# Patient Record
Sex: Male | Born: 1955 | Race: Black or African American | Hispanic: No | Smoking: Current every day smoker
Health system: Southern US, Community
[De-identification: ages and names within clinical notes are randomized; demographics above are authoritative.]

## PROBLEM LIST (undated history)

## (undated) DIAGNOSIS — I10 Essential (primary) hypertension: Secondary | ICD-10-CM

## (undated) DIAGNOSIS — E119 Type 2 diabetes mellitus without complications: Secondary | ICD-10-CM

## (undated) DIAGNOSIS — E785 Hyperlipidemia, unspecified: Secondary | ICD-10-CM

## (undated) HISTORY — PX: JOINT REPLACEMENT: SHX530

---

## 2004-11-11 ENCOUNTER — Emergency Department: Payer: Self-pay | Admitting: Emergency Medicine

## 2006-08-22 ENCOUNTER — Emergency Department: Payer: Self-pay | Admitting: Emergency Medicine

## 2009-04-23 ENCOUNTER — Ambulatory Visit: Payer: Self-pay | Admitting: General Practice

## 2011-03-11 IMAGING — CR DG KNEE 1-2V*R*
1 series · 2 of 2 positions shown · non-contrast
Comparison: none

REASON FOR EXAM: rt hip replacment, osteoarthrtis in rt leg back probs
fax report
COMMENTS:  to [facility] [phone]

[Series 1: view not recorded · 0.17mm/px · 2 of 2 slices shown]
[im 1/2]
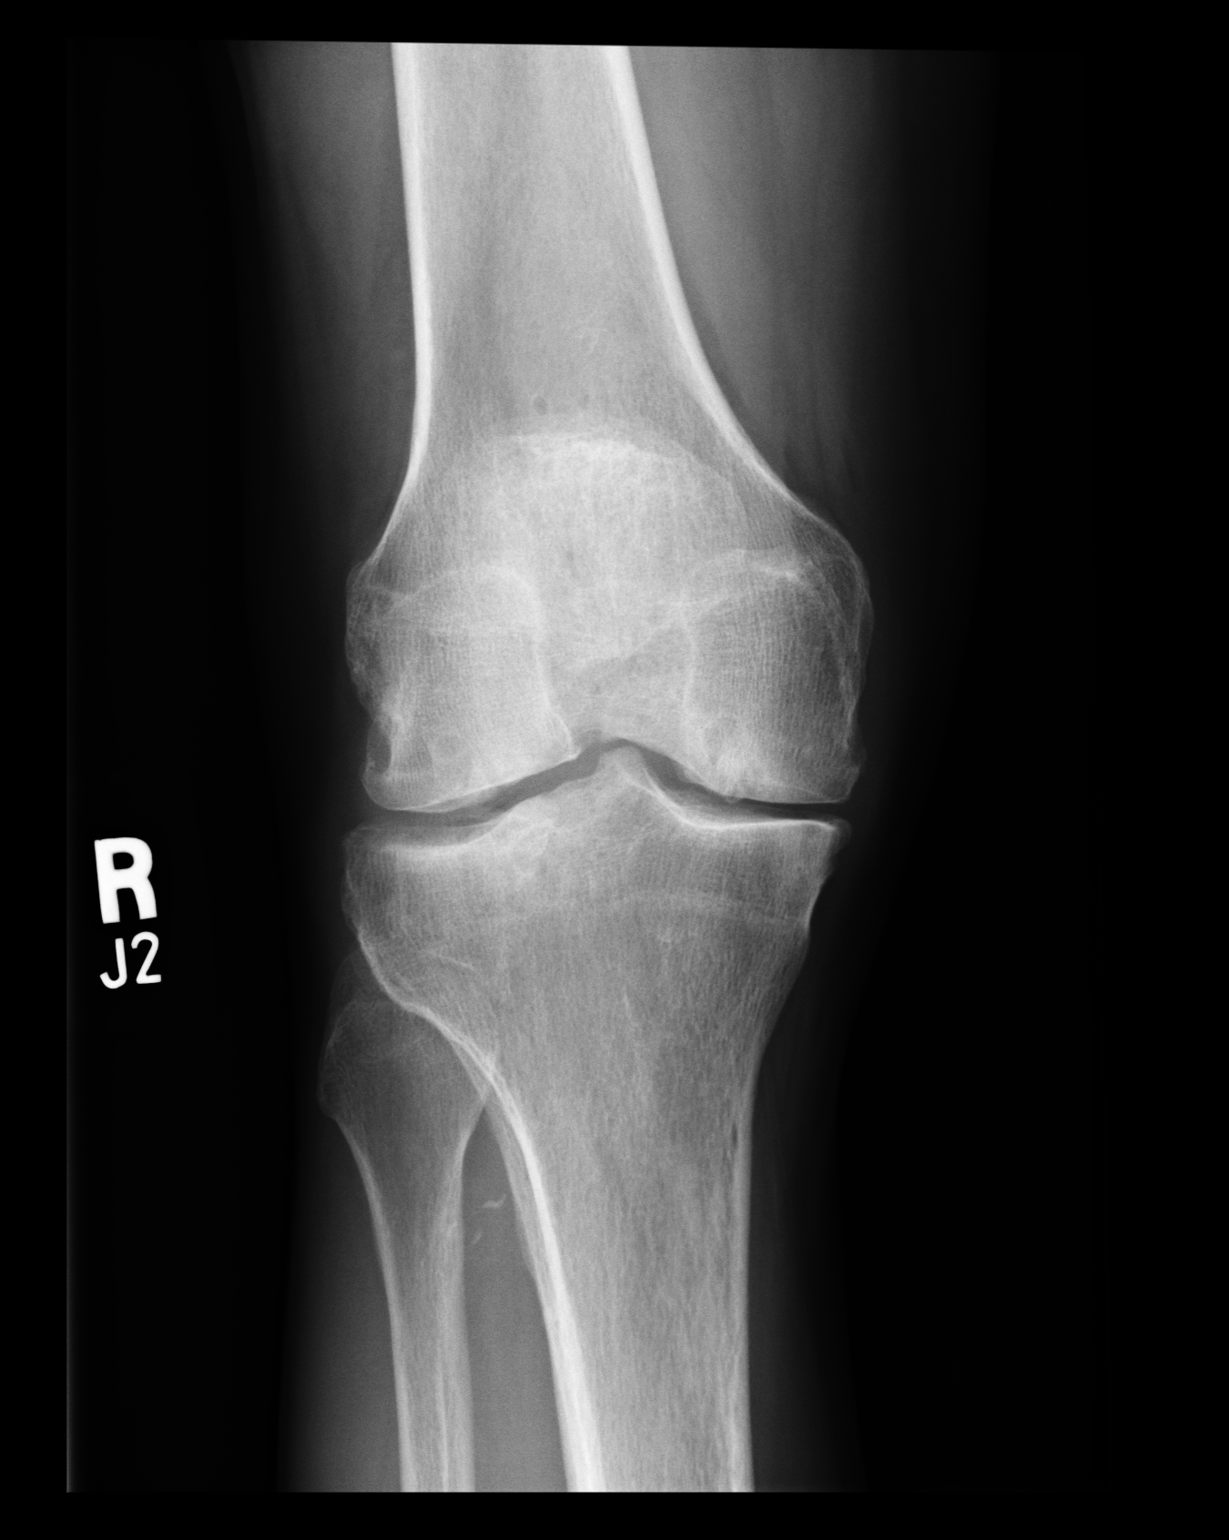
[im 2/2]
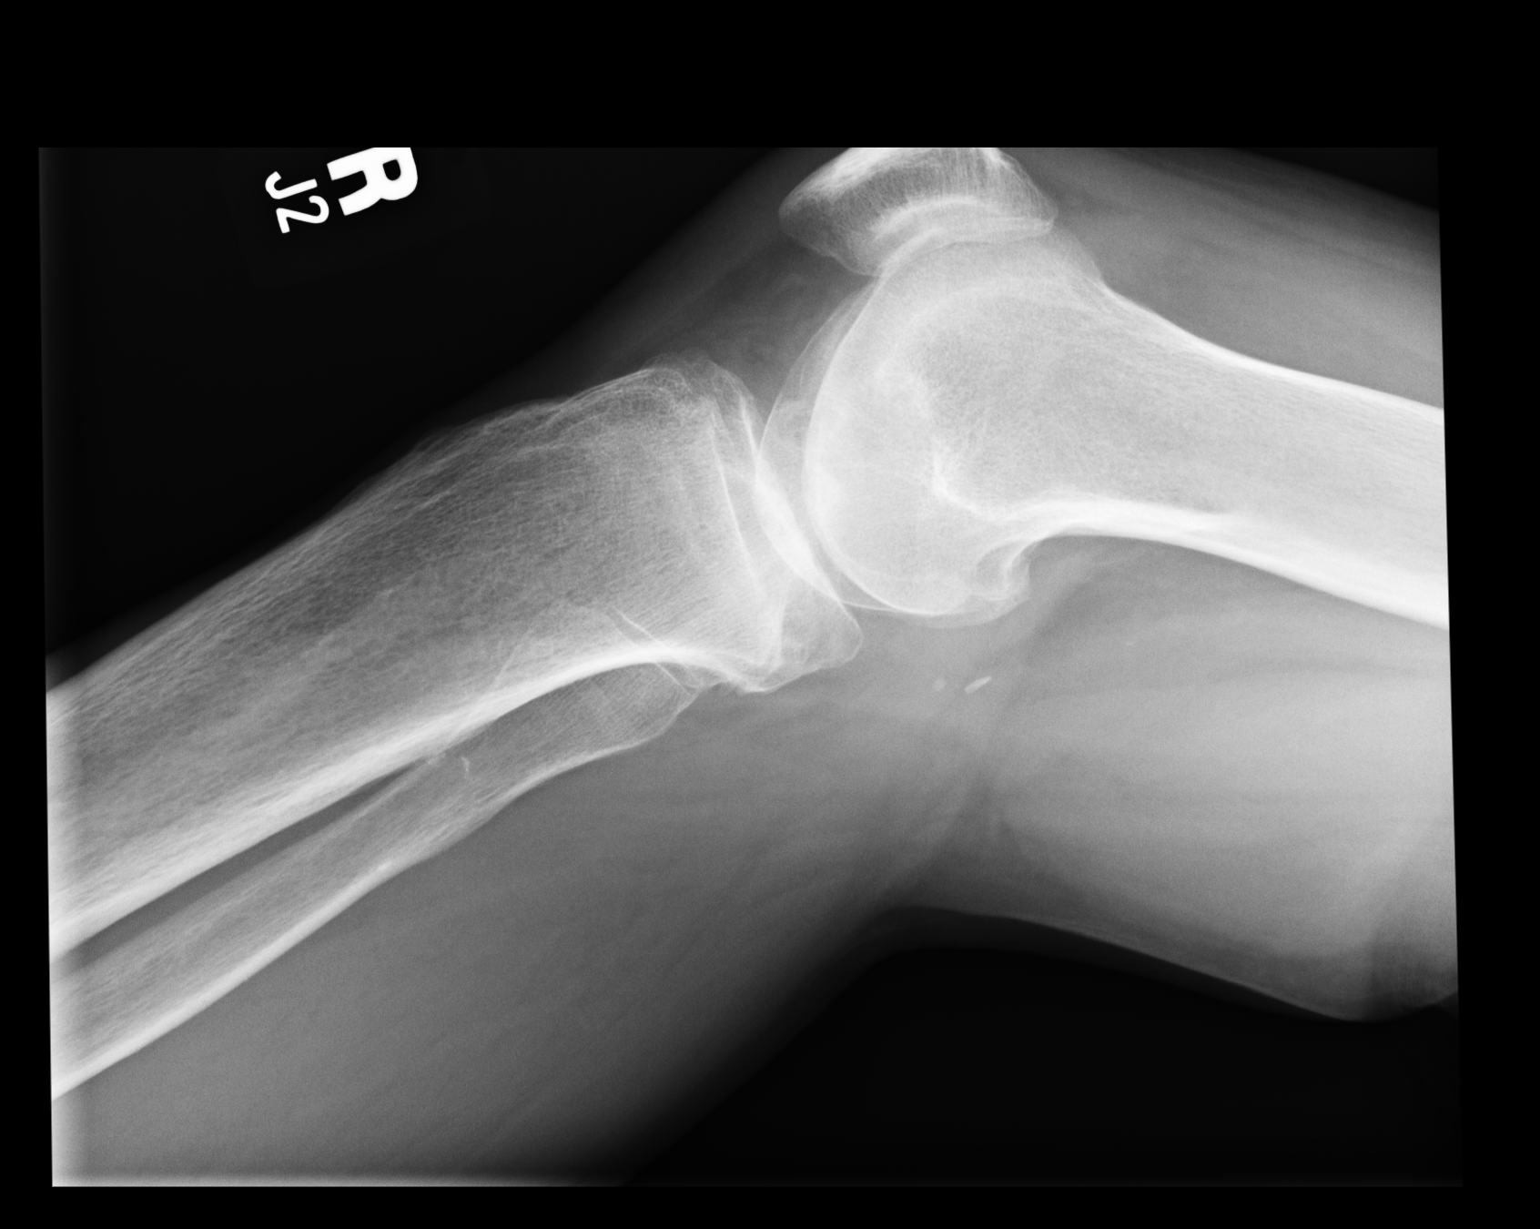

[2 of 2 positions shown; findings below may reference images not displayed]

PROCEDURE:     DXR - DXR KNEE RIGHT AP AND LATERAL  - April 23, 2009  [DATE]

RESULT:     AP and lateral views were obtained. There is slight arthritic
spurring at the knee joint medially. The knee joint space is slightly
narrowed medially consistent with arthritic change. The patella is intact.
No lytic or blastic lesions are seen.
IMPRESSION: 1. No fracture is observed.
2. There are observed in arthritic changes at the knee medially as noted
above.

## 2013-04-04 ENCOUNTER — Emergency Department: Payer: Self-pay | Admitting: Emergency Medicine

## 2013-04-04 LAB — URINALYSIS, COMPLETE
BILIRUBIN, UR: NEGATIVE
Bacteria: NONE SEEN
Blood: NEGATIVE
Glucose,UR: >=500 mg/dL (ref 0–75)
Ketone: NEGATIVE
LEUKOCYTE ESTERASE: NEGATIVE
NITRITE: NEGATIVE
PROTEIN: NEGATIVE
Ph: 6 (ref 4.5–8.0)
RBC,UR: <1 /HPF (ref 0–5)
Specific Gravity: 1.024 (ref 1.003–1.030)
WBC UR: 1 /HPF (ref 0–5)

## 2013-04-04 LAB — CBC WITH DIFFERENTIAL/PLATELET
Basophil #: 0 x10 3/mm 3
Basophil %: 0.9 %
Eosinophil #: 0.1 x10 3/mm 3
Eosinophil %: 2.3 %
HCT: 47.3 %
HGB: 16 g/dL
Lymphocyte %: 41.9 %
Lymphs Abs: 2.1 x10 3/mm 3
MCH: 31.9 pg
MCHC: 33.8 g/dL
MCV: 95 fL
Monocyte #: 0.4 "x10 3/mm "
Monocyte %: 7.7 %
Neutrophil #: 2.3 x10 3/mm 3
Neutrophil %: 47.2 %
Platelet: 108 x10 3/mm 3 — ABNORMAL LOW
RBC: 5.01 x10 6/mm 3
RDW: 13 %
WBC: 5 x10 3/mm 3

## 2013-04-04 LAB — HEMOGLOBIN A1C: Hemoglobin A1C: 10.7 % — ABNORMAL HIGH

## 2013-04-04 LAB — BASIC METABOLIC PANEL
Anion Gap: 4 — ABNORMAL LOW (ref 7–16)
BUN: 16 mg/dL (ref 7–18)
Calcium, Total: 9.4 mg/dL (ref 8.5–10.1)
Chloride: 96 mmol/L — ABNORMAL LOW (ref 98–107)
Co2: 30 mmol/L (ref 21–32)
Creatinine: 1.05 mg/dL (ref 0.60–1.30)
EGFR (African American): >60
EGFR (Non-African Amer.): >60
Glucose: 410 mg/dL — ABNORMAL HIGH (ref 65–99)
OSMOLALITY: 279 (ref 275–301)
Potassium: 3.8 mmol/L (ref 3.5–5.1)
Sodium: 130 mmol/L — ABNORMAL LOW (ref 136–145)

## 2014-06-13 ENCOUNTER — Emergency Department
Admit: 2014-06-13 | Disposition: A | Discharge status: Home / Self Care | Payer: Self-pay | Admitting: Emergency Medicine

## 2014-06-13 LAB — CBC WITH DIFFERENTIAL/PLATELET
Basophil #: 0 10*3/uL (ref 0.0–0.1)
Basophil %: 0.3 %
EOS PCT: 0.1 %
Eosinophil #: 0 10*3/uL (ref 0.0–0.7)
HCT: 56.9 % — ABNORMAL HIGH (ref 40.0–52.0)
HGB: 19.2 g/dL — ABNORMAL HIGH (ref 13.0–18.0)
LYMPHS ABS: 3.5 10*3/uL (ref 1.0–3.6)
Lymphocyte %: 22.8 %
MCH: 31.8 pg (ref 26.0–34.0)
MCHC: 33.7 g/dL (ref 32.0–36.0)
MCV: 94 fL (ref 80–100)
Monocyte #: 1.3 x10 3/mm — ABNORMAL HIGH (ref 0.2–1.0)
Monocyte %: 8.7 %
NEUTROS PCT: 68.1 %
Neutrophil #: 10.4 10*3/uL — ABNORMAL HIGH (ref 1.4–6.5)
Platelet: 234 10*3/uL (ref 150–440)
RBC: 6.03 10*6/uL — ABNORMAL HIGH (ref 4.40–5.90)
RDW: 13.5 % (ref 11.5–14.5)
WBC: 15.2 10*3/uL — ABNORMAL HIGH (ref 3.8–10.6)

## 2014-06-13 LAB — COMPREHENSIVE METABOLIC PANEL
ALK PHOS: 108 U/L
ANION GAP: 16 (ref 7–16)
Albumin: 4.8 g/dL
BILIRUBIN TOTAL: 1.4 mg/dL — AB
BUN: 15 mg/dL
CHLORIDE: 86 mmol/L — AB
CREATININE: 1.57 mg/dL — AB
Calcium, Total: 9.8 mg/dL
Co2: 34 mmol/L — ABNORMAL HIGH
EGFR (African American): 55 — ABNORMAL LOW
GFR CALC NON AF AMER: 48 — AB
Glucose: 152 mg/dL — ABNORMAL HIGH
POTASSIUM: 3.7 mmol/L
SGOT(AST): 38 U/L
SGPT (ALT): 22 U/L
Sodium: 136 mmol/L
TOTAL PROTEIN: 9 g/dL — AB

## 2014-06-13 LAB — URINALYSIS, COMPLETE
Bacteria: NONE SEEN
Bilirubin,UR: NEGATIVE
Glucose,UR: 50 mg/dL (ref 0–75)
Leukocyte Esterase: NEGATIVE
NITRITE: NEGATIVE
Ph: 5 (ref 4.5–8.0)
Specific Gravity: 1.026 (ref 1.003–1.030)
Squamous Epithelial: 2

## 2014-06-13 LAB — TROPONIN I: Troponin-I: <0.03 ng/mL

## 2014-06-13 LAB — LIPASE, BLOOD: LIPASE: 38 U/L

## 2016-09-07 ENCOUNTER — Encounter: Payer: Self-pay | Admitting: Emergency Medicine

## 2016-09-07 ENCOUNTER — Emergency Department
Admission: EM | Admit: 2016-09-07 | Discharge: 2016-09-07 | Disposition: A | Discharge status: Home / Self Care | Payer: Medicare Other | Attending: Emergency Medicine | Admitting: Emergency Medicine

## 2016-09-07 DIAGNOSIS — K047 Periapical abscess without sinus: Secondary | ICD-10-CM

## 2016-09-07 DIAGNOSIS — F17200 Nicotine dependence, unspecified, uncomplicated: Secondary | ICD-10-CM | POA: Diagnosis not present

## 2016-09-07 DIAGNOSIS — R600 Localized edema: Secondary | ICD-10-CM | POA: Diagnosis present

## 2016-09-07 DIAGNOSIS — I10 Essential (primary) hypertension: Secondary | ICD-10-CM | POA: Insufficient documentation

## 2016-09-07 DIAGNOSIS — K0889 Other specified disorders of teeth and supporting structures: Secondary | ICD-10-CM | POA: Diagnosis not present

## 2016-09-07 DIAGNOSIS — E119 Type 2 diabetes mellitus without complications: Secondary | ICD-10-CM | POA: Insufficient documentation

## 2016-09-07 HISTORY — DX: Essential (primary) hypertension: I10

## 2016-09-07 HISTORY — DX: Type 2 diabetes mellitus without complications: E11.9

## 2016-09-07 HISTORY — DX: Hyperlipidemia, unspecified: E78.5

## 2016-09-07 MED ORDER — AMOXICILLIN 875 MG PO TABS: 875.0000 mg | ORAL_TABLET | Freq: Two times a day (BID) | ORAL | 0 refills | Status: DC

## 2016-09-07 MED ORDER — TRAMADOL HCL 50 MG PO TABS: 50.0000 mg | ORAL_TABLET | Freq: Four times a day (QID) | ORAL | 0 refills | Status: DC | PRN

## 2016-09-07 MED ORDER — MAGIC MOUTHWASH W/LIDOCAINE: 5.0000 mL | Freq: Four times a day (QID) | ORAL | 0 refills | Status: DC

## 2016-09-07 NOTE — ED Provider Notes (Signed)
[facility] Emergency Department Provider Note  ____________________________________________  Time seen: Approximately 6:12 PM  I have reviewed the triage vital signs and the nursing notes.   HISTORY  Chief Complaint Oral Swelling    HPI Maxwell Harper is a 61 y.o. male who presents emergency department for possible dental infection. Patient reports that he is a partial that has been "rubbing" and caused a lesion midline upper dentition. Patient denies any fevers or chills, difficulty breathing or swallowing. The patient does have a dentist but is unable to be seen in timely manner. No other complaints at this time. Trying extra toothbrushing, warm saltwater gargles, was straining type mouthwash.   Past Medical History:  Diagnosis Date  . Diabetes mellitus without complication (HCC)   . Hyperlipemia   . Hypertension     There are no active problems to display for this patient.   Past Surgical History:  Procedure Laterality Date  . JOINT REPLACEMENT      Prior to Admission medications   Medication Sig Start Date End Date Taking? Authorizing Provider  amoxicillin (AMOXIL) 875 MG tablet Take 1 tablet (875 mg total) by mouth 2 (two) times daily. 09/07/16   Cuthriell, Delorise Royals, PA-C  magic mouthwash w/lidocaine SOLN Take 5 mLs by mouth 4 (four) times daily. 09/07/16   Cuthriell, Delorise Royals, PA-C  traMADol (ULTRAM) 50 MG tablet Take 1 tablet (50 mg total) by mouth every 6 (six) hours as needed. 09/07/16   Cuthriell, Delorise Royals, PA-C    Allergies Patient has no known allergies.  History reviewed. No pertinent family history.  Social History Social History  Substance Use Topics  . Smoking status: Current Every Day Smoker    Packs/day: 1.00  . Smokeless tobacco: Never Used  . Alcohol use Yes     Review of Systems  Constitutional: No fever/chills Eyes: No visual changes. No discharge ENT: Possible dental infection of her  dentition. Cardiovascular: no chest pain. Respiratory: no cough. No SOB. Gastrointestinal: No abdominal pain.  No nausea, no vomiting.  No diarrhea.  No constipation. Musculoskeletal: Negative for musculoskeletal pain. Skin: Negative for rash, abrasions, lacerations, ecchymosis. Neurological: Negative for headaches, focal weakness or numbness. 10-point ROS otherwise negative.  ____________________________________________   PHYSICAL EXAM:  VITAL SIGNS: ED Triage Vitals  Enc Vitals Group     BP 09/07/16 1754 139/86     Pulse Rate 09/07/16 1754 67     Resp --      Temp 09/07/16 1754 98.4 F (36.9 C)     Temp Source 09/07/16 1754 Oral     SpO2 09/07/16 1754 99 %     Weight 09/07/16 1802 165 lb (74.8 kg)     Height 09/07/16 1802 6\' 1"  (1.854 m)     Head Circumference --      Peak Flow --      Pain Score 09/07/16 1801 10     Pain Loc --      Pain Edu? --      Excl. in GC? --      Constitutional: Alert and oriented. Well appearing and in no acute distress. Eyes: Conjunctivae are normal. PERRL. EOMI. Head: Atraumatic. ENT:      Ears:       Nose: No congestion/rhinnorhea.      Mouth/Throat: Mucous membranes are moist. Patient has multiple missing teeth throughout dentition. Patient has an area of erythema and edema where second incisor would be in the upper dentition. No visible abscess. No swelling of  lip or gum line. Neck: No stridor.   Hematological/Lymphatic/Immunilogical: No cervical lymphadenopathy. Cardiovascular: Normal rate, regular rhythm. Normal S1 and S2.  Good peripheral circulation. Respiratory: Normal respiratory effort without tachypnea or retractions. Lungs CTAB. Good air entry to the bases with no decreased or absent breath sounds. Musculoskeletal: Full range of motion to all extremities. No gross deformities appreciated. Neurologic:  Normal speech and language. No gross focal neurologic deficits are appreciated.  Skin:  Skin is warm, dry and intact. No rash  noted. Psychiatric: Mood and affect are normal. Speech and behavior are normal. Patient exhibits appropriate insight and judgement.   ____________________________________________   LABS (all labs ordered are listed, but only abnormal results are displayed)  Labs Reviewed - No data to display ____________________________________________  EKG   ____________________________________________  RADIOLOGY   No results found.  ____________________________________________    PROCEDURES  Procedure(s) performed:    Procedures    Medications - No data to display   ____________________________________________   INITIAL IMPRESSION / ASSESSMENT AND PLAN / ED COURSE  Pertinent labs & imaging results that were available during my care of the patient were reviewed by me and considered in my medical decision making (see chart for details).  Review of the [place] CSRS was performed in accordance of the [facility] prior to dispensing any controlled drugs.     Patient's diagnosis is consistent with dental infection from ill fitting partial. Patient will be discharged home with prescriptions for antibiotics, magic mouthwash, very limited pain medication. Patient is to follow up with primary care or dentist as needed or otherwise directed. Patient is given ED precautions to return to the ED for any worsening or new symptoms.     ____________________________________________  FINAL CLINICAL IMPRESSION(S) / ED DIAGNOSES  Final diagnoses:  Dental infection      NEW MEDICATIONS STARTED DURING THIS VISIT:  New Prescriptions   AMOXICILLIN (AMOXIL) 875 MG TABLET    Take 1 tablet (875 mg total) by mouth 2 (two) times daily.   MAGIC MOUTHWASH W/LIDOCAINE SOLN    Take 5 mLs by mouth 4 (four) times daily.   TRAMADOL (ULTRAM) 50 MG TABLET    Take 1 tablet (50 mg total) by mouth every 6 (six) hours as needed.        This chart was dictated using voice recognition software/Dragon. Despite  best efforts to proofread, errors can occur which can change the meaning. Any change was purely unintentional.    Racheal PatchesCuthriell, Jonathan D, PA-C 09/07/16 1817    Minna AntisPaduchowski, Kevin, MD 09/07/16 2306

## 2016-09-07 NOTE — ED Triage Notes (Signed)
Pt with gum abscess front upper middle. [facility] pt. Pain 10/10

## 2017-06-08 ENCOUNTER — Emergency Department
Admission: EM | Admit: 2017-06-08 | Discharge: 2017-06-08 | Disposition: A | Discharge status: Home / Self Care | Payer: Medicare Other | Attending: Emergency Medicine | Admitting: Emergency Medicine

## 2017-06-08 ENCOUNTER — Other Ambulatory Visit: Payer: Self-pay

## 2017-06-08 DIAGNOSIS — Z711 Person with feared health complaint in whom no diagnosis is made: Secondary | ICD-10-CM | POA: Insufficient documentation

## 2017-06-08 DIAGNOSIS — I10 Essential (primary) hypertension: Secondary | ICD-10-CM | POA: Insufficient documentation

## 2017-06-08 DIAGNOSIS — E119 Type 2 diabetes mellitus without complications: Secondary | ICD-10-CM | POA: Insufficient documentation

## 2017-06-08 DIAGNOSIS — F17200 Nicotine dependence, unspecified, uncomplicated: Secondary | ICD-10-CM | POA: Diagnosis not present

## 2017-06-08 LAB — URINALYSIS, COMPLETE (UACMP) WITH MICROSCOPIC
Bacteria, UA: NONE SEEN
Bilirubin Urine: NEGATIVE
Glucose, UA: NEGATIVE mg/dL
Hgb urine dipstick: NEGATIVE
Ketones, ur: NEGATIVE mg/dL
Leukocytes, UA: NEGATIVE
Nitrite: NEGATIVE
Protein, ur: NEGATIVE mg/dL
SPECIFIC GRAVITY, URINE: 1.006 (ref 1.005–1.030)
pH: 6 (ref 5.0–8.0)

## 2017-06-08 LAB — CHLAMYDIA/NGC RT PCR (ARMC ONLY)
CHLAMYDIA TR: NOT DETECTED
N GONORRHOEAE: NOT DETECTED

## 2017-06-08 NOTE — ED Notes (Signed)
Pt ambulatory to desk without difficulty. Denies any symptoms but was called by a male that he needed to be checked.

## 2017-06-08 NOTE — ED Notes (Signed)
See triage note.  Patient denies complaints at this time.

## 2017-06-08 NOTE — Discharge Instructions (Signed)
Follow-up with [facility] if any continued problems.  [facility] pressure rechecked as it was elevated at 175/94. You will receive a phone call if there are any positive findings on your cultures. No trichomonas was seen in your urine specimen.

## 2017-06-08 NOTE — ED Provider Notes (Signed)
[facility] Emergency Department Provider Note  ____________________________________________   First MD Initiated Contact with Patient 06/08/17 [id number]     (approximate)  I have reviewed the triage vital signs and the nursing notes.   HISTORY  Chief Complaint Exposure to STD   HPI Maxwell Harper is a 62 y.o. male is here for a check for STDs.  Patient states that he was told by his "male companion" that he should come to the emergency department to be checked.  Patient states he has not had any symptoms.  He denies any penile discharge, rash, pain.   Past Medical History:  Diagnosis Date  . Diabetes mellitus without complication (HCC)   . Hyperlipemia   . Hypertension     There are no active problems to display for this patient.   Past Surgical History:  Procedure Laterality Date  . JOINT REPLACEMENT      Prior to Admission medications   Not on File    Allergies Patient has no known allergies.  No family history on file.  Social History Social History   Tobacco Use  . Smoking status: Current Every Day Smoker    Packs/day: 1.00  . Smokeless tobacco: Never Used  Substance Use Topics  . Alcohol use: Yes  . Drug use: No    Review of Systems Constitutional: No fever/chills Cardiovascular: Denies chest pain. Respiratory: Denies shortness of breath. Gastrointestinal: No abdominal pain.  No nausea, no vomiting.  Genitourinary: Negative for dysuria.  Negative for penile discharge. Musculoskeletal: Negative for back pain. Skin: Negative for rash. Neurological: Negative for headaches ___________________________________________   PHYSICAL EXAM:  VITAL SIGNS: ED Triage Vitals [06/08/17 0824]  Enc Vitals Group     BP (!) 175/94     Pulse Rate 83     Resp 18     Temp 97.7 F (36.5 C)     Temp Source Oral     SpO2 100 %     Weight 165 lb (74.8 kg)     Height 6\' 1"  (1.854 m)     Head Circumference      Peak Flow      Pain  Score      Pain Loc      Pain Edu?      Excl. in GC?    Constitutional: Alert and oriented. Well appearing and in no acute distress. Eyes: Conjunctivae are normal.  Head: Atraumatic. Neck: No stridor.   Cardiovascular: Normal rate, regular rhythm. Grossly normal heart sounds.  Good peripheral circulation. Respiratory: Normal respiratory effort.  No retractions. Lungs CTAB. Musculoskeletal: No lower extremity tenderness nor edema.   Neurologic:  Normal speech and language. No gross focal neurologic deficits are appreciated. No gait instability. Skin:  Skin is warm, dry and intact. No rash noted. Psychiatric: Mood and affect are normal. Speech and behavior are normal.  ____________________________________________   LABS (all labs ordered are listed, but only abnormal results are displayed)  Labs Reviewed  URINALYSIS, COMPLETE (UACMP) WITH MICROSCOPIC - Abnormal; Notable for the following components:      Result Value   Color, Urine YELLOW (*)    APPearance CLEAR (*)    Squamous Epithelial / LPF 0-5 (*)    All other components within normal limits  CHLAMYDIA/NGC RT PCR ([facility] ONLY)     PROCEDURES  Procedure(s) performed: None  Procedures  Critical Care performed: No  ____________________________________________   INITIAL IMPRESSION / ASSESSMENT AND PLAN / ED COURSE  Clinical Course as of  Jun 09 1331  Wed Jun 08, 2017  0959 Squamous Epithelial / LPF(!): 0-5 [RS]    Clinical Course User Index [RS] Tommi Rumps, PA-C   Patient was reassured that urinalysis did not have any bacteria or any evidence that would be suspicious for an STD.  Culture for gonorrhea and chlamydia was pending at the time of his discharge.  Patient is aware that he will be called if his test results are positive.  He is to follow-up with the [facility] department STD clinic if any continued concerns. ____________________________________________   FINAL CLINICAL  IMPRESSION(S) / ED DIAGNOSES  Final diagnoses:  Concern about STD in male without diagnosis     ED Discharge Orders    None       Note:  This document was prepared using Dragon voice recognition software and may include unintentional dictation errors.    Tommi Rumps, PA-C 06/08/17 1501    Governor Rooks, MD 06/08/17 [phone]

## 2017-06-08 NOTE — ED Triage Notes (Signed)
Pt reports that a male companion called him to tell him that she has a STD and that he needs to be checked. Pt denies any symptoms.

## 2019-10-27 ENCOUNTER — Emergency Department
Admission: EM | Admit: 2019-10-27 | Discharge: 2019-10-27 | Disposition: A | Discharge status: Home / Self Care | Payer: Medicare PPO | Attending: Emergency Medicine | Admitting: Emergency Medicine

## 2019-10-27 ENCOUNTER — Other Ambulatory Visit: Payer: Self-pay

## 2019-10-27 DIAGNOSIS — F17200 Nicotine dependence, unspecified, uncomplicated: Secondary | ICD-10-CM | POA: Insufficient documentation

## 2019-10-27 DIAGNOSIS — R0981 Nasal congestion: Secondary | ICD-10-CM | POA: Diagnosis present

## 2019-10-27 DIAGNOSIS — I10 Essential (primary) hypertension: Secondary | ICD-10-CM | POA: Insufficient documentation

## 2019-10-27 DIAGNOSIS — B349 Viral infection, unspecified: Secondary | ICD-10-CM | POA: Insufficient documentation

## 2019-10-27 DIAGNOSIS — Z20822 Contact with and (suspected) exposure to covid-19: Secondary | ICD-10-CM | POA: Insufficient documentation

## 2019-10-27 DIAGNOSIS — E119 Type 2 diabetes mellitus without complications: Secondary | ICD-10-CM | POA: Diagnosis not present

## 2019-10-27 LAB — COMPREHENSIVE METABOLIC PANEL
ALT: 49 U/L — ABNORMAL HIGH (ref 0–44)
AST: 69 U/L — ABNORMAL HIGH (ref 15–41)
Albumin: 4.8 g/dL (ref 3.5–5.0)
Alkaline Phosphatase: 114 U/L (ref 38–126)
Anion gap: 17 — ABNORMAL HIGH (ref 5–15)
BUN: 9 mg/dL (ref 8–23)
CO2: 29 mmol/L (ref 22–32)
Calcium: 9.6 mg/dL (ref 8.9–10.3)
Chloride: 84 mmol/L — ABNORMAL LOW (ref 98–111)
Creatinine, Ser: 0.9 mg/dL (ref 0.61–1.24)
GFR calc Af Amer: >60 mL/min (ref >60)
GFR calc non Af Amer: >60 mL/min (ref >60)
Glucose, Bld: 143 mg/dL — ABNORMAL HIGH (ref 70–99)
Potassium: 4.2 mmol/L (ref 3.5–5.1)
Sodium: 130 mmol/L — ABNORMAL LOW (ref 135–145)
Total Bilirubin: 1.1 mg/dL (ref 0.3–1.2)
Total Protein: 8 g/dL (ref 6.5–8.1)

## 2019-10-27 LAB — CBC WITH DIFFERENTIAL/PLATELET
Abs Immature Granulocytes: 0.04 10*3/uL (ref 0.00–0.07)
Basophils Absolute: 0.1 10*3/uL (ref 0.0–0.1)
Basophils Relative: 0 %
Eosinophils Absolute: 0.2 10*3/uL (ref 0.0–0.5)
Eosinophils Relative: 2 %
HCT: 42.6 % (ref 39.0–52.0)
Hemoglobin: 15.5 g/dL (ref 13.0–17.0)
Immature Granulocytes: 0 %
Lymphocytes Relative: 17 %
Lymphs Abs: 1.9 10*3/uL (ref 0.7–4.0)
MCH: 33.6 pg (ref 26.0–34.0)
MCHC: 36.4 g/dL — ABNORMAL HIGH (ref 30.0–36.0)
MCV: 92.4 fL (ref 80.0–100.0)
Monocytes Absolute: 0.7 10*3/uL (ref 0.1–1.0)
Monocytes Relative: 7 %
Neutro Abs: 8.4 10*3/uL — ABNORMAL HIGH (ref 1.7–7.7)
Neutrophils Relative %: 74 %
Platelets: 309 10*3/uL (ref 150–400)
RBC: 4.61 MIL/uL (ref 4.22–5.81)
RDW: 12 % (ref 11.5–15.5)
WBC: 11.3 10*3/uL — ABNORMAL HIGH (ref 4.0–10.5)
nRBC: 0 % (ref 0.0–0.2)

## 2019-10-27 LAB — LIPASE, BLOOD: Lipase: 37 U/L (ref 11–51)

## 2019-10-27 LAB — SARS CORONAVIRUS 2 BY RT PCR (HOSPITAL ORDER, PERFORMED IN ~~LOC~~ HOSPITAL LAB): SARS Coronavirus 2: NEGATIVE

## 2019-10-27 MED ORDER — SODIUM CHLORIDE 0.9 % IV BOLUS
1000.0000 mL | Freq: Once | INTRAVENOUS | Status: AC
  Administered 2019-10-27: 1000 mL via INTRAVENOUS

## 2019-10-27 MED ORDER — ONDANSETRON 4 MG PO TBDP: 4.0000 mg | ORAL_TABLET | Freq: Three times a day (TID) | ORAL | 0 refills | Status: AC | PRN

## 2019-10-27 MED ORDER — ONDANSETRON 4 MG PO TBDP
4.0000 mg | ORAL_TABLET | Freq: Once | ORAL | Status: AC
  Administered 2019-10-27: 4 mg via ORAL
  Filled 2019-10-27: qty 1

## 2019-10-27 NOTE — ED Notes (Signed)
Pt c/o congestion, body aches, chills, and nausea

## 2019-10-27 NOTE — ED Provider Notes (Signed)
[facility] Emergency Department Provider Note  ____________________________________________   First MD Initiated Contact with Patient 10/27/19 [phone]     (approximate)  I have reviewed the triage vital signs and the nursing notes.   HISTORY  Chief Complaint URI   HPI Maxwell Harper is a 64 y.o. male presents today with complaint of congestion, body aches, chills, nausea and vomiting.  Patient states that he took care of his grandchildren this week who are all sick with "RSV".  Patient states that in the last 24 hours he has had approximately 6 episodes of diarrhea and a few episodes of vomiting.  Lab work was reviewed with patient and he admits that he drinks approximately 3 beers every day which accounts for his mildly elevated liver functions.  Patient reports of subjective fever.  He denies any known Covid exposure.  Patient states that he quit smoking last year.         Past Medical History:  Diagnosis Date   Diabetes mellitus without complication (HCC)    Hyperlipemia    Hypertension     There are no problems to display for this patient.   Past Surgical History:  Procedure Laterality Date   JOINT REPLACEMENT      Prior to Admission medications   Medication Sig Start Date End Date Taking? Authorizing Provider  ondansetron (ZOFRAN ODT) 4 MG disintegrating tablet Take 1 tablet (4 mg total) by mouth every 8 (eight) hours as needed for nausea or vomiting. 10/27/19   Tommi Rumps, PA-C    Allergies Patient has no known allergies.  No family history on file.  Social History Social History   Tobacco Use   Smoking status: Current Every Day Smoker    Packs/day: 1.00   Smokeless tobacco: Never Used  Vaping Use   Vaping Use: Never assessed  Substance Use Topics   Alcohol use: Yes   Drug use: No    Review of Systems Constitutional: Subjective fever/chills Eyes: No visual changes. ENT: Positive for nasal  congestion. Cardiovascular: Denies chest pain. Respiratory: Denies shortness of breath. Gastrointestinal: No abdominal pain.  Positive nausea, positive vomiting.  Positive diarrhea.   Genitourinary: Negative for dysuria. Musculoskeletal: Positive for body aches. Skin: Negative for rash. Neurological: Negative for headaches, focal weakness or numbness. ____________________________________________   PHYSICAL EXAM:  VITAL SIGNS: ED Triage Vitals [10/27/19 0511]  Enc Vitals Group     BP (!) 157/86     Pulse Rate 83     Resp 17     Temp 97.8 F (36.6 C)     Temp Source Oral     SpO2 100 %     Weight 138 lb (62.6 kg)     Height 6\' 1"  (1.854 m)     Head Circumference      Peak Flow      Pain Score 0     Pain Loc      Pain Edu?      Excl. in GC?     Constitutional: Alert and oriented. Well appearing and in no acute distress. Eyes: Conjunctivae are normal.  Head: Atraumatic. Nose: No congestion/rhinnorhea. Mouth/Throat: Mucous membranes are moist.  Oropharynx non-erythematous. Neck: No stridor.   Cardiovascular: Normal rate, regular rhythm. Grossly normal heart sounds.  Good peripheral circulation. Respiratory: Normal respiratory effort.  No retractions. Lungs CTAB. Gastrointestinal: Soft and nontender. No distention.  Bowel sounds are normoactive x4 quadrants at this time. Musculoskeletal: Moves upper and lower extremities without any difficulty and  patient is able to walk without any assistance. Neurologic:  Normal speech and language. No gross focal neurologic deficits are appreciated. No gait instability. Skin:  Skin is warm, dry and intact. No rash noted. Psychiatric: Mood and affect are normal. Speech and behavior are normal.  ____________________________________________   LABS (all labs ordered are listed, but only abnormal results are displayed)  Labs Reviewed  CBC WITH DIFFERENTIAL/PLATELET - Abnormal; Notable for the following components:      Result Value    WBC 11.3 (*)    MCHC 36.4 (*)    Neutro Abs 8.4 (*)    All other components within normal limits  COMPREHENSIVE METABOLIC PANEL - Abnormal; Notable for the following components:   Sodium 130 (*)    Chloride 84 (*)    Glucose, Bld 143 (*)    AST 69 (*)    ALT 49 (*)    Anion gap 17 (*)    All other components within normal limits  SARS CORONAVIRUS 2 BY RT PCR (HOSPITAL ORDER, PERFORMED IN [facility])  LIPASE, BLOOD     PROCEDURES  Procedure(s) performed (including Critical Care):  Procedures   ____________________________________________   INITIAL IMPRESSION / ASSESSMENT AND PLAN / ED COURSE  As part of my medical decision making, I reviewed the following data within the electronic MEDICAL RECORD NUMBER Notes from prior ED visits and [place] Controlled Substance Database  Maxwell Harper was evaluated in Emergency Department on 10/27/2019 for the symptoms described in the history of present illness. He was evaluated in the context of the global COVID-19 pandemic, which necessitated consideration that the patient might be at risk for infection with the SARS-CoV-2 virus that causes COVID-19. Institutional protocols and algorithms that pertain to the evaluation of patients at risk for COVID-19 are in a state of rapid change based on information released by regulatory bodies including the CDC and federal and state organizations. These policies and algorithms were followed during the patient's care in the ED.  64 year old male presents to the ED with complaint of waking up with symptoms of congestion, body aches, chills with nausea and diarrhea.  Patient has history of taking care of grandkids that were positive for RSV.  Patient has had approximately 6 watery stools in the last 24 hours.  Sodium was minimally decreased at 130.  AST and ALT were elevated however patient reports that he does drink alcohol approximately 3 daily.  Covid test is negative.  Patient was given a saline bolus  and was feeling better prior to discharge.  Patient is encouraged to continue with fluids until his diarrhea has resolved and then gradually add food back starting with a bland diet.  He is to follow-up with his PCP if any continued problems or return to the emergency department if any severe worsening of his symptoms.  ____________________________________________   FINAL CLINICAL IMPRESSION(S) / ED DIAGNOSES  Final diagnoses:  Viral illness     ED Discharge Orders         Ordered    ondansetron (ZOFRAN ODT) 4 MG disintegrating tablet  Every 8 hours PRN     Discontinue  Reprint     10/27/19 0910           Note:  This document was prepared using Dragon voice recognition software and may include unintentional dictation errors.    Tommi Rumps, PA-C 10/27/19 1102    Delton [place], MD 10/27/19 [phone]

## 2019-10-27 NOTE — ED Triage Notes (Signed)
Patient reports sinus congestion since yesterday.  Today woke with bodyaches, chills, vomiting and diarrhea.

## 2019-10-27 NOTE — Discharge Instructions (Addendum)
Follow-up with your primary care provider if any continued problems or concerns. Continue with clear liquids today until diarrhea is completely resolved and then you may start adding foods slowly back into the diet. Begin with bland foods such as toast, bananas, applesauce and advance slowly. Zofran was sent to your pharmacy if needed for nausea and vomiting.

## 2022-02-12 DEATH — deceased
